# Patient Record
Sex: Female | Born: 2000 | Race: Black or African American | Hispanic: No | Marital: Single | Smoking: Never smoker
Health system: Southern US, Community
[De-identification: ages and names within clinical notes are randomized; demographics above are authoritative.]

## PROBLEM LIST (undated history)

## (undated) DIAGNOSIS — E669 Obesity, unspecified: Secondary | ICD-10-CM

## (undated) DIAGNOSIS — E301 Precocious puberty: Secondary | ICD-10-CM

## (undated) DIAGNOSIS — L83 Acanthosis nigricans: Secondary | ICD-10-CM

## (undated) DIAGNOSIS — E049 Nontoxic goiter, unspecified: Secondary | ICD-10-CM

## (undated) DIAGNOSIS — E063 Autoimmune thyroiditis: Secondary | ICD-10-CM

## (undated) DIAGNOSIS — R7303 Prediabetes: Secondary | ICD-10-CM

## (undated) HISTORY — DX: Obesity, unspecified: E66.9

## (undated) HISTORY — DX: Precocious puberty: E30.1

## (undated) HISTORY — DX: Nontoxic goiter, unspecified: E04.9

## (undated) HISTORY — DX: Autoimmune thyroiditis: E06.3

## (undated) HISTORY — DX: Acanthosis nigricans: L83

## (undated) HISTORY — DX: Prediabetes: R73.03

---
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
## 2020-05-12 ENCOUNTER — Ambulatory Visit (INDEPENDENT_AMBULATORY_CARE_PROVIDER_SITE_OTHER): Payer: Medicaid Other | Admitting: "Endocrinology

## 2020-06-15 NOTE — Progress Notes (Deleted)
Subjective:  Patient Name: Janet Wilkins Date of Birth: 11/25/2000  MRN: [id number]  Janet Wilkins  presents to the office today for follow-up evaluation and management of her morbid obesity, acanthosis, prediabetes, dyspepsia, primary hypothyroidism, goiter, and thyroiditis.  HISTORY OF PRESENT ILLNESS:   Janet Wilkins is a 20 y.o. African American young woman.   Janet Wilkins was unaccompanied.  1. The patient was first referred to [place] on 06/15/06 by the staff of [facility] for evaluation and management of precocity and obesity. She was then 47 years old.  A. The child was the product of an uneventful pregnancy until induction of labor was initiated at 39 weeks due to loss of amniotic fluid. The child's birth weight was 9 pounds. She had onset of axillary hair, pubic hair, and low back hair at age 89. She had onset of breast development at age 86. She had begun to use deodorant in the past year. Mother stated she did she did not eat a lot of junk. Family history was positive for obesity and acanthosis nigricans in the mother. Mother underwent menarche in the ninth grade. There was little information about father's side of the family, but there was diabetes on his side. There was no diabetes, thyroid disease, or heart disease on the mother side of the family.   B. On physical examination, both height and weight were greater than the 97%, but she was much further above the 97% in weight than in height. She was 18-20 pounds overweight. The thyroid gland was enlarged at 12-15 grams in size. She had 2+ acanthosis nigricans of her posterior neck. She had multiple long hairs in the axillae. Her pubic hair was Tanner stage III-IV. Her areolae were enlarged, at 22 mm on the right and 23 mm on the left. There was a 10 mm breast bud on the right, but I could not palpate a breast bud on the left. Laboratory data showed a normal CMP. Fasting glucose was 89 and fasting insulin was 19. Cholesterol was elevated at 162. Her  triglycerides were 52. HDL was 60. LDL was mildly elevated at 92. TSH was 3.018. Her free T4 was 1.17. Her free T3 was 3.0. Her TPO antibody was mildly elevated at 40.3. Her FSH was 1.5. Her LH was less than 0.1. Total testosterone was 23 (normal less than 10). Estradiol was 22.1 (normal less than 11.8). DHEAS was 98 (normal 35-430). Her androstenedione was 9 (normal 6-115). 17-hydroxyprogesterone was 32 (normal less than 90). Urine free cortisol was 15.1 (normal less than or equal to 30). Bone age film performed on 05/14/06 showed a bone age of 6 years 3 months at a chronologic age 67 years 2 months. Brain CT and abdominal CT scans performed on 12/26/05 showed normal brain tissue, normal pituitary gland, and normal abdomen.  C. 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 Overly fat adipose cells produced cytokines that caused resistance to insulin. Hyperinsulinemia in turn caused increased androgens production in the adrenal glands and ovaries. The fat cells then aromatized the extra androgen to female hormones, causing breast development. Hyperinsulinemia also caused acanthosis nigricans. In addition, hyperinsulinemia stimulated gastric acid production, which led to dyspepsia and overeating. Her level of obesity and insulin resistance caused her to be considered prediabetic. I talked with the mother at that time about our Eat Right Diet plan. I suggested that  she try to ensure that the child exercise for 45-60 minutes per day.   2. During the next two years we saw little improvement in obesity.   A. In late 2009 the mother heeded our advice to work harder on the diet. In early 2010 the mother finally agreed to give the child metformin, 500 mg, twice daily. On 06/15/09 the child's weight had decreased to 88.5 pounds, but thereafter began to increase along the 90th.  When the weight decreased, the height decreased to about the 94th percentile, but then continued to progress along that percentile. At the patient's [facility] visit on  07/14/10 it appeared that the patient's dyspepsia was getting worse. I started her on ranitidine, 150 mg twice daily.  Laboratory data at that point showed the [facility] was 4.2 and the [facility] was 2.9. Her testosterone was 21.29 (normal less than 10). Her estradiol was 15.3 (normal less than 11.8).  She had menarche in December 2012 at age 44.   B. At some time between her initial visit with me on 06/15/06 and her first visit using the [facility] computer system on 11/08/10, I diagnosed her with primary acquired hypothyroidism and started her Synthroid.  3. The patient's last [facility] visit was on 11/11/19. She had stopped taking Synthroid and metformin about 12-18 months prior. Her height was at the 73.52%. Her weight was at the 99.11%. Her HbA1c was 5.2%. Her TFTs were mid-euthyroid without having been taking Synthroid.  A. In the interim she has been healthy.   B. She does have any new issues, problems, or medications.   C. She has not been following our Eat Right Diet plan, using the [facility] Diet recipes, or exercising.   4. Pertinent Review of Systems:  Constitutional: The patient feels "fine". She says that her energy is good, her stamina is good, and she is not fatigued.  Eyes: Vision seems to be good as long as she wears her eyeglasses. There are no recognized eye problems. Neck: The patient has no complaints of anterior neck swelling, soreness, tenderness, pressure, discomfort, or difficulty swallowing.   Heart: Heart rate increases with exercise or other physical activity. The patient has no complaints of palpitations, irregular heart beats, chest pain, or chest pressure.   Gastrointestinal: She does not have much hunger. Bowel movents seem normal. The patient has no complaints of acid reflux, upset stomach, stomach aches or pains, diarrhea, or  constipation.  Hands: No tremor; She is able to text and play video games.  Legs: Muscle mass and strength seem normal. There are no complaints of numbness, tingling, burning, or pain. No edema is noted.  Feet: There are no obvious foot problems. There are no complaints of numbness, tingling, burning, or pain. No edema is noted. Neurologic: There are no recognized problems with muscle movement and strength, sensation, or coordination. GYN: Menarche was in December 2012. LMP was a week ago. Menstrual periods have been regular.    PAST MEDICAL, FAMILY, AND SOCIAL HISTORY  Past Medical History:  Diagnosis Date  . Acanthosis nigricans, acquired   . Goiter   . Hypothyroidism, acquired, autoimmune   . Isosexual precocity   . Obesity   . Prediabetes   . Thyroiditis, autoimmune     Family History  Problem Relation Age of Onset  . Obesity Mother   . Diabetes type II Father   . Diabetes type II Paternal Uncle   . Diabetes type II Maternal Grandmother   . Thyroid disease Neg Hx   .  Heart disease Neg Hx      Current Outpatient Medications:  .  metFORMIN (GLUCOPHAGE) 500 MG tablet, TAKE ONE TABLET BY MOUTH TWICE DAILY (Patient not taking: Reported on 04/19/2018), Disp: 60 tablet, Rfl: 10 .  ranitidine (ZANTAC) 150 MG tablet, Take 1 tablet (150 mg total) by mouth 2 (two) times daily. (Patient not taking: Reported on 04/19/2018), Disp: 60 tablet, Rfl: 5 .  SYNTHROID 50 MCG tablet, Take 1 tablet (50 mcg total) by mouth daily. (Patient not taking: Reported on 04/19/2018), Disp: 30 each, Rfl: 6 .  Vitamin D, Ergocalciferol, (DRISDOL) 1.25 MG (50000 UT) CAPS capsule, Take 1 capsule (50,000 Units total) by mouth every 7 (seven) days. (Patient not taking: Reported on 11/11/2019), Disp: 8 capsule, Rfl: 1  Allergies as of 06/17/2020  . (No Known Allergies)     reports that she has never smoked. She has never used smokeless tobacco. Pediatric History  Patient Parents/Guardians  . Schank,Ebele  (Mother/Guardian)   Other Topics Concern  . Not on file  Social History Narrative   Lives with mom, step-dad, and step sister.    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    She enjoys Netflix, listening to music, and has recently started to the gym daily at 7pm, and is finding it to be a good stress releasor.     1. School and Family: She is in her third year at [employer]. She is pursuing a major in peace and conflict studies.  2. Activities: She occasionally goes to the gym.    3. Primary Care Provider: [facility]  REVIEW OF SYSTEMS: There are no other significant problems involving Tattiana's other body systems.   Objective:  Vital Signs:  There were no vitals taken for this visit.   Ht Readings from Last 3 Encounters:  04/19/18 5' 5.83" (1.672 m) (74 %, Z= 0.63)*  03/11/12 5' 3.78" (1.62 m) (94 %, Z= 1.53)*  11/08/10 5' 0.63" (1.54 m) (96 %, Z= 1.74)*   * Growth percentiles are based on CDC (Girls, 2-20 Years) data.   Wt Readings from Last 3 Encounters:  11/11/19 263 lb 4.8 oz (119.4 kg) (>99 %, Z= 2.61)*  04/19/18 239 lb (108.4 kg) (>99 %, Z= 2.37)*  03/11/12 152 lb (68.9 kg) (98 %, Z= 2.06)*   * Growth percentiles are based on CDC (Girls, 2-20 Years) data.   There is no height or weight on file to calculate BSA. Facility age limit for growth percentiles is 20 years. Facility age limit for growth percentiles is 20 years.  PHYSICAL EXAM:   Constitutional: The patient appears healthy, but morbidly obese. The patient's height has plateaued at the 73.52%. Her weight has increased 24 pounds in the past 18 months to the 99.55%. Her BMI has increased to >98.63%. She is alert, bright, and very intelligent. Her affect and insight are normal.  Head: The head is normocephalic. Face: The face appears normal. There are no obvious dysmorphic features. Eyes: The eyes appear to be normally formed and spaced. Gaze is conjugate.  There is no obvious arcus or proptosis. Moisture appears normal. Ears: The ears are normally placed and appear externally normal. Mouth: The oropharynx and tongue appear normal. Dentition appears to be normal for age. Oral moisture is normal. Neck: The neck appears to be visibly normal. No carotid bruits are noted. The thyroid gland is enlarged at about 21+ grams in size. The consistency of the  thyroid gland is normal. The thyroid gland is not tender to palpation. She has 2+ circumferential acanthosis nigricans.  Lungs: Clear, moves air well Heart: Normal S1 and S2, no abnormal heart sounds Abdomen: Morbidly obese, nontender Hands: 2+ acanthosis of her IP joints Legs: No edema Neuro: 5+ strength UEs and LEs; Sensation to touch intact in legs and feet.  LAB DATA:  Labs 11/11/19: HbA1c 4.9%, CBC 94; TSH 1.47, free T4 1.1, free T3 3.0; CMP normal' PTH 24, calcium 10.1, 25-OH vitamin D 10  Labs 04/19/18: HbA1c 5.2%, CBG 86; TSH 1.09, free T4 1.0, free T3 3.1; CMP normal, except AST 38 (ref 12-32) and ALT 34 (ref 5-32); PTH 54 (ref 12-71), calcium 9.8 (ref 8.9-10.4), 25-OH vitamin D 8 (ref 30-100)  Labs 09/05/17: HbA1c 4.7%, CBG 209; TSH 2.54, free T4 1.1; CMP normal except for CO2 18; cholesterol 136, triglycerides 54, HDL 51, LDL 72; 25-OH vitamin D 21   Labs 03/11/12: Hemoglobin A1c 5.4% ; TSH 1.846, free T4 1.25, free T3 3.0  Labs 10/31/10: Hemoglobin A1c 5.5%. TSH 1.85, free T4 1.04, free T3 3.2; Estradiol 19.5., testosterone less than 10.0.     Assessment and Plan:   ASSESSMENT:  1.  Morbid Obesity: The patient's overly fat adipose cells produce excessive amount of cytokines that both directly and indirectly cause serious health problems.   A. Some cytokines cause hypertension. Other cytokines cause inflammation within arterial walls. Still other cytokines contribute to dyslipidemia. Yet other cytokines cause resistance to insulin and compensatory hyperinsulinemia.  B. The hyperinsulinemia,  in turn, causes acquired acanthosis nigricans and  excess gastric acid production resulting in dyspepsia (excess belly hunger, upset stomach, and often stomach pains).   C. Hyperinsulinemia in children causes more rapid linear growth than usual. The combination of tall child and heavy body stimulates the onset of central precocity in ways that we still do not understand. The final adult height is often much reduced.  D. Hyperinsulinemia in women also stimulates excess production of testosterone by the ovaries and both androstenedione and DHEA by the adrenal glands, resulting in hirsutism, irregular menses, secondary amenorrhea, and infertility. This symptom complex is commonly called Polycystic Ovarian Syndrome, but many endocrinologists still prefer the diagnostic label of the Stein-leventhal Syndrome.  E. When the insulin resistance overwhelms the ability of the beta cells to produce ever increasing amounts of insulin, glucose intolerance ensues. First the patients develop prediabetes. If the patient then do not lose adequate amounts of fat weight, frank T2DM ensues.   F. Her obesity is much worse in August 2021. She continues to take in more starches and sugars than she burns off. 2.  Prediabetes/hyperglycemia: As above. Her hemoglobin A1c is again within normal limits and is lower than it was at her last visit.  3.  Hypertension: As above. Her DBP is relatively high for her age.  4. Acanthosis nigricans, acquired: As above 5. Dyspepsia: As above 6. Hypothyroidism:   A. [place] was euthyroid in February 2020 without having been on any thyroid hormone for at least 12-18 months.    B. It is possible that the growth of her thyroid gland through age 48-16 may have increased the number of thyrocytes sufficient for her to no longer ned the Synthroid. However, given her elevated TPO antibody in the past, it is still likely that she will develop hypothyroidism again in the future.  7-8. Goiter/Hashimoto's  thyroiditis:   A. Her thyroid gland is larger in August 2021. The waxing and waning of  thyroid gland size is c/w evolving Hashimoto's disease.   B. Her thyroiditis is clinically quiescent. 9. Low bicarbonate: The cause of this problem is unclear. Her CO2 on 11/11/19 was normal at 27 (ref 20-32).We will follow this issue over time.  10. Vitamin D deficiency disease: She was very deficiency in vitamin D in Febrauray 2020. She was still deficient in vitamin D in August 2021. We need to re-assess her vitamin D status.  11. Elevated transaminase/Non-alcoholic fatty liver disease: Her AST and ALT were elevated in February 2020. These enzyme levels were normal in August 2021.   PLAN:  1. Diagnostic: Repeat TFTs, CMP, 25-OH vitamin D, PTH, calcium 2. Therapeutic: Eat Right. Exercise for an hour at least 5 times per week. . 3. Patient education: I reviewed our Eat Right Diet plan and the [facility] Diet recipes.  4. Follow-up: 6 months  Level of Service: This visit lasted in excess of 65 minutes. More than 50% of the visit was devoted to counseling.  Molli Knock, MD, CDE Pediatric and Adult Endocrinology

## 2020-06-17 ENCOUNTER — Ambulatory Visit (INDEPENDENT_AMBULATORY_CARE_PROVIDER_SITE_OTHER): Payer: Medicaid Other | Admitting: "Endocrinology
